# Patient Record
Sex: Female | Born: 1975 | Race: White | Hispanic: No | Marital: Single | Smoking: Current every day smoker
Health system: Southern US, Community
[De-identification: ages and names within clinical notes are randomized; demographics above are authoritative.]

---

## 1997-06-23 ENCOUNTER — Inpatient Hospital Stay (HOSPITAL_COMMUNITY): Admission: AD | Admit: 1997-06-23 | Discharge: 1997-06-23 | Payer: Self-pay | Admitting: Obstetrics

## 1997-08-17 ENCOUNTER — Inpatient Hospital Stay (HOSPITAL_COMMUNITY): Admission: AD | Admit: 1997-08-17 | Discharge: 1997-08-17 | Payer: Self-pay | Admitting: *Deleted

## 1997-11-05 ENCOUNTER — Inpatient Hospital Stay (HOSPITAL_COMMUNITY): Admission: AD | Admit: 1997-11-05 | Discharge: 1997-11-10 | Payer: Self-pay | Admitting: Obstetrics & Gynecology

## 1997-11-10 ENCOUNTER — Inpatient Hospital Stay (HOSPITAL_COMMUNITY): Admission: AD | Admit: 1997-11-10 | Discharge: 1997-11-10 | Payer: Self-pay | Admitting: *Deleted

## 1998-02-14 ENCOUNTER — Emergency Department (HOSPITAL_COMMUNITY): Admission: EM | Admit: 1998-02-14 | Discharge: 1998-02-14 | Payer: Self-pay | Admitting: Emergency Medicine

## 1998-03-03 ENCOUNTER — Emergency Department (HOSPITAL_COMMUNITY): Admission: EM | Admit: 1998-03-03 | Discharge: 1998-03-03 | Payer: Self-pay | Admitting: Emergency Medicine

## 1998-04-25 ENCOUNTER — Emergency Department (HOSPITAL_COMMUNITY): Admission: EM | Admit: 1998-04-25 | Discharge: 1998-04-25 | Payer: Self-pay | Admitting: Emergency Medicine

## 1998-05-09 ENCOUNTER — Emergency Department (HOSPITAL_COMMUNITY): Admission: EM | Admit: 1998-05-09 | Discharge: 1998-05-09 | Payer: Self-pay | Admitting: Emergency Medicine

## 1998-05-20 ENCOUNTER — Emergency Department (HOSPITAL_COMMUNITY): Admission: EM | Admit: 1998-05-20 | Discharge: 1998-05-20 | Payer: Self-pay | Admitting: Emergency Medicine

## 1998-07-05 ENCOUNTER — Emergency Department (HOSPITAL_COMMUNITY): Admission: EM | Admit: 1998-07-05 | Discharge: 1998-07-05 | Payer: Self-pay | Admitting: Emergency Medicine

## 1998-08-09 ENCOUNTER — Emergency Department (HOSPITAL_COMMUNITY): Admission: EM | Admit: 1998-08-09 | Discharge: 1998-08-09 | Payer: Self-pay | Admitting: Emergency Medicine

## 1998-08-12 ENCOUNTER — Emergency Department (HOSPITAL_COMMUNITY): Admission: EM | Admit: 1998-08-12 | Discharge: 1998-08-12 | Payer: Self-pay | Admitting: Emergency Medicine

## 1998-12-07 ENCOUNTER — Emergency Department (HOSPITAL_COMMUNITY): Admission: EM | Admit: 1998-12-07 | Discharge: 1998-12-08 | Payer: Self-pay | Admitting: Emergency Medicine

## 1999-01-18 ENCOUNTER — Emergency Department (HOSPITAL_COMMUNITY): Admission: EM | Admit: 1999-01-18 | Discharge: 1999-01-18 | Payer: Self-pay | Admitting: Podiatry

## 1999-01-25 ENCOUNTER — Emergency Department (HOSPITAL_COMMUNITY): Admission: EM | Admit: 1999-01-25 | Discharge: 1999-01-25 | Payer: Self-pay | Admitting: Emergency Medicine

## 1999-04-11 ENCOUNTER — Emergency Department (HOSPITAL_COMMUNITY): Admission: EM | Admit: 1999-04-11 | Discharge: 1999-04-11 | Payer: Self-pay | Admitting: Emergency Medicine

## 1999-04-23 ENCOUNTER — Inpatient Hospital Stay (HOSPITAL_COMMUNITY): Admission: AD | Admit: 1999-04-23 | Discharge: 1999-04-23 | Payer: Self-pay | Admitting: Obstetrics & Gynecology

## 1999-07-04 ENCOUNTER — Emergency Department (HOSPITAL_COMMUNITY): Admission: EM | Admit: 1999-07-04 | Discharge: 1999-07-04 | Payer: Self-pay | Admitting: Emergency Medicine

## 1999-07-28 ENCOUNTER — Emergency Department (HOSPITAL_COMMUNITY): Admission: EM | Admit: 1999-07-28 | Discharge: 1999-07-28 | Payer: Self-pay | Admitting: Emergency Medicine

## 1999-07-29 ENCOUNTER — Emergency Department (HOSPITAL_COMMUNITY): Admission: EM | Admit: 1999-07-29 | Discharge: 1999-07-29 | Payer: Self-pay | Admitting: *Deleted

## 1999-09-13 ENCOUNTER — Emergency Department (HOSPITAL_COMMUNITY): Admission: EM | Admit: 1999-09-13 | Discharge: 1999-09-13 | Payer: Self-pay | Admitting: Emergency Medicine

## 2000-08-03 ENCOUNTER — Inpatient Hospital Stay (HOSPITAL_COMMUNITY): Admission: EM | Admit: 2000-08-03 | Discharge: 2000-08-04 | Payer: Self-pay | Admitting: Psychiatry

## 2000-08-24 ENCOUNTER — Inpatient Hospital Stay (HOSPITAL_COMMUNITY): Admission: AD | Admit: 2000-08-24 | Discharge: 2000-08-24 | Payer: Self-pay | Admitting: *Deleted

## 2000-08-24 ENCOUNTER — Encounter: Payer: Self-pay | Admitting: *Deleted

## 2001-02-18 ENCOUNTER — Encounter (INDEPENDENT_AMBULATORY_CARE_PROVIDER_SITE_OTHER): Payer: Self-pay

## 2001-02-18 ENCOUNTER — Inpatient Hospital Stay (HOSPITAL_COMMUNITY): Admission: AD | Admit: 2001-02-18 | Discharge: 2001-02-25 | Payer: Self-pay | Admitting: *Deleted

## 2001-02-26 ENCOUNTER — Encounter: Admission: RE | Admit: 2001-02-26 | Discharge: 2001-03-28 | Payer: Self-pay | Admitting: *Deleted

## 2001-09-09 ENCOUNTER — Other Ambulatory Visit: Admission: RE | Admit: 2001-09-09 | Discharge: 2001-09-09 | Payer: Self-pay | Admitting: Family Medicine

## 2002-09-21 ENCOUNTER — Encounter: Payer: Self-pay | Admitting: Emergency Medicine

## 2002-09-21 ENCOUNTER — Emergency Department (HOSPITAL_COMMUNITY): Admission: EM | Admit: 2002-09-21 | Discharge: 2002-09-21 | Payer: Self-pay | Admitting: Emergency Medicine

## 2002-10-14 ENCOUNTER — Emergency Department (HOSPITAL_COMMUNITY): Admission: EM | Admit: 2002-10-14 | Discharge: 2002-10-14 | Payer: Self-pay | Admitting: Emergency Medicine

## 2003-03-02 ENCOUNTER — Other Ambulatory Visit: Admission: RE | Admit: 2003-03-02 | Discharge: 2003-03-02 | Payer: Self-pay | Admitting: Internal Medicine

## 2003-03-02 ENCOUNTER — Other Ambulatory Visit: Admission: RE | Admit: 2003-03-02 | Discharge: 2003-03-02 | Payer: Self-pay | Admitting: Obstetrics and Gynecology

## 2009-05-10 ENCOUNTER — Emergency Department (HOSPITAL_COMMUNITY): Admission: EM | Admit: 2009-05-10 | Discharge: 2009-05-10 | Payer: Self-pay | Admitting: Emergency Medicine

## 2009-07-03 ENCOUNTER — Emergency Department (HOSPITAL_COMMUNITY): Admission: EM | Admit: 2009-07-03 | Discharge: 2009-07-03 | Payer: Self-pay | Admitting: Emergency Medicine

## 2010-01-31 ENCOUNTER — Emergency Department (HOSPITAL_COMMUNITY)
Admission: EM | Admit: 2010-01-31 | Discharge: 2010-01-31 | Payer: Self-pay | Source: Home / Self Care | Admitting: Emergency Medicine

## 2010-04-17 LAB — URINALYSIS, ROUTINE W REFLEX MICROSCOPIC
Bilirubin Urine: NEGATIVE
Glucose, UA: NEGATIVE mg/dL
Hgb urine dipstick: NEGATIVE
Ketones, ur: NEGATIVE mg/dL
Nitrite: NEGATIVE
Protein, ur: NEGATIVE mg/dL
Specific Gravity, Urine: 1.012 (ref 1.005–1.030)
Urobilinogen, UA: 0.2 mg/dL (ref 0.0–1.0)
pH: 6 (ref 5.0–8.0)

## 2010-04-17 LAB — POCT PREGNANCY, URINE: Preg Test, Ur: NEGATIVE

## 2010-04-24 LAB — CBC
HCT: 40.4 % (ref 36.0–46.0)
Hemoglobin: 13.5 g/dL (ref 12.0–15.0)
MCHC: 33.3 g/dL (ref 30.0–36.0)
MCV: 89.2 fL (ref 78.0–100.0)
RDW: 13.5 % (ref 11.5–15.5)

## 2010-04-24 LAB — RAPID STREP SCREEN (MED CTR MEBANE ONLY): Streptococcus, Group A Screen (Direct): NEGATIVE

## 2010-04-24 LAB — DIFFERENTIAL
Basophils Absolute: 0.2 10*3/uL — ABNORMAL HIGH (ref 0.0–0.1)
Basophils Relative: 1 % (ref 0–1)
Eosinophils Absolute: 0 10*3/uL (ref 0.0–0.7)
Eosinophils Relative: 0 % (ref 0–5)
Monocytes Absolute: 0.7 10*3/uL (ref 0.1–1.0)

## 2010-04-24 LAB — URINALYSIS, ROUTINE W REFLEX MICROSCOPIC
Bilirubin Urine: NEGATIVE
Ketones, ur: NEGATIVE mg/dL
Nitrite: NEGATIVE
Urobilinogen, UA: 1 mg/dL (ref 0.0–1.0)

## 2010-04-24 LAB — POCT I-STAT, CHEM 8
Calcium, Ion: 1.15 mmol/L (ref 1.12–1.32)
Chloride: 100 mEq/L (ref 96–112)
Glucose, Bld: 99 mg/dL (ref 70–99)
HCT: 44 % (ref 36.0–46.0)

## 2010-05-16 ENCOUNTER — Emergency Department (HOSPITAL_COMMUNITY)
Admission: EM | Admit: 2010-05-16 | Discharge: 2010-05-16 | Disposition: A | Discharge status: Home / Self Care | Payer: Medicaid Other | Attending: Emergency Medicine | Admitting: Emergency Medicine

## 2010-05-16 ENCOUNTER — Emergency Department (HOSPITAL_COMMUNITY): Payer: Medicaid Other

## 2010-05-16 DIAGNOSIS — I10 Essential (primary) hypertension: Secondary | ICD-10-CM | POA: Insufficient documentation

## 2010-05-16 DIAGNOSIS — M25539 Pain in unspecified wrist: Secondary | ICD-10-CM | POA: Insufficient documentation

## 2010-05-16 DIAGNOSIS — J45909 Unspecified asthma, uncomplicated: Secondary | ICD-10-CM | POA: Insufficient documentation

## 2010-05-28 ENCOUNTER — Emergency Department (HOSPITAL_COMMUNITY)
Admission: EM | Admit: 2010-05-28 | Discharge: 2010-05-28 | Disposition: A | Discharge status: Home / Self Care | Payer: Medicaid Other | Attending: Emergency Medicine | Admitting: Emergency Medicine

## 2010-05-28 DIAGNOSIS — M545 Low back pain, unspecified: Secondary | ICD-10-CM | POA: Insufficient documentation

## 2010-05-28 DIAGNOSIS — M25539 Pain in unspecified wrist: Secondary | ICD-10-CM | POA: Insufficient documentation

## 2010-05-28 DIAGNOSIS — I10 Essential (primary) hypertension: Secondary | ICD-10-CM | POA: Insufficient documentation

## 2010-05-28 DIAGNOSIS — F17200 Nicotine dependence, unspecified, uncomplicated: Secondary | ICD-10-CM | POA: Insufficient documentation

## 2010-06-23 NOTE — Op Note (Signed)
[facility]  Patient:    Judy Cruz, Judy Cruz Visit Number: [id number] MRN: [id number]          Service Type: OBS Location: 910A 9144 01 Attending Physician:  Michaelle Copas Dictated by:   Conni Elliot, M.D. Proc. Date: 02/22/01 Admit Date:  02/18/2001                             Operative Report  PREOPERATIVE DIAGNOSIS:       Intrauterine pregnancy at 32-[redacted] weeks gestation, premature prolonged ruptured membranes, preterm labor, breech presentation, prior cesarean section x2, refuses trial of labor, desire for surgical sterilization.  POSTOPERATIVE DIAGNOSIS:      Intrauterine pregnancy at 32-[redacted] weeks gestation, premature prolonged ruptured membranes, preterm labor, breech presentation, prior cesarean section x2, refuses trial of labor, desire for surgical sterilization, adhesion of the anteperitoneum to the anterior abdominal wall with inclusive of the bladder and the window on the left approximately 1-2 cm in the prior uterine scar.  PROCEDURE:                    Low transverse cesarean section, bilateral tubal ligation.  SURGEON:                      Conni Elliot, M.D.  ANESTHESIA:                   Spinal.  OPERATIVE FINDINGS:           A 3 pound 11 ounce female with Apgars of 7 and 7.  Cord pH 7.35.  Placenta was sent to pathology.  The patient has a deep septated uterus and the fetus was in the right horn.  OPERATIVE PROCEDURE:          Patient was brought to the operating room. Patient received spinal anesthetic.  The patient was prepped and draped in a sterile fashion.  A low transverse Pfannenstiel incision was made.  Incision made through skin and subcutaneous fascia.  Peritoneal cavity was entered. Bladder flap was created.  Low transverse uterine incision was made.  Baby was brought through the operative field by grasping of the lower extremities and membranes were ruptured.  Baby was delivered without  difficulty and no head entrapment.  Cord was doubly clamped and cut and baby was handed to neonatologist in attendance.  Placenta was delivered spontaneously.  The uterus was closed in a routine fashion.  There was no well defined bladder flap to reapproximate nor was there anterior peritoneum to reapproximate.  The rectus muscles were brought together in the midline.  Fascia and subcutaneous skin were closed in routine fashion.  Estimated blood loss less than 800 cc. Needle and sponge count correct. Dictated by:   Conni Elliot, M.D. Attending Physician:  Michaelle Copas DD:  02/22/01 TD:  02/24/01 Job: [id number]

## 2010-06-23 NOTE — Discharge Summary (Signed)
[facility]  Patient:    Judy Cruz, Judy Cruz               MRN: [id number] Adm. Date:  [id number] Disc. Date: [id number] Attending:  Michaelle Copas                           Discharge Summary  CHIEF COMPLAINT AND HISTORY OF PRESENT ILLNESS:  This is the first admission to [facility] for this 35 year old female admitted due to suicidal ideas and anger outbursts, history of bipolar disorder, reports she has not been taking her medications regularly for the last two weeks.  Having mood swings with anger outbursts toward her boyfriend, denies suicidal ideas, denies homicidal ideas, denies auditory or visual hallucinations.  Has suspected she has been pregnant for some time now, does not know last menstrual period.  Increased stress as her mother was diagnosed with terminal cancer.  PAST PSYCHIATRIC HISTORY:  Sees Dr. Tyson Dense in [facility], was hospitalized last year, bipolar, borderline personality disorder, multiple suicide attempts.  ALCOHOL AND DRUG HISTORY:  Occasional use of alcohol, denies any other substances.  MEDICATIONS: 1. Neurontin 800 mg four times a day. 2. Zyprexa 10 mg at bed time.  MENTAL STATUS EXAMINATION:  Revealed a casually dressed female.  Grooming: Satisfactory.  Cooperative and pleasant.  Speech: Normal pace and tone, somewhat hyperverbal.  Mood: Elevated.  Thought processes: Logical, coherent, and relevant.  No suicidal ideas, no homicidal ideas.  Well oriented to person, place, and time.  ADMITTING DIAGNOSES: Axis I:    Bipolar disorder, hypomanic. Axis II:   Borderline personality disorder. Axis III:  Possible pregnancy. Axis IV:   Moderate. Axis V:    Global assessment of functioning upon admission 40-45.  HOSPITAL COURSE:  She was admitted and started in intensive individual and group psychotherapy.  On June 30 just by being on the unit, felt better. Wanted to stay on  her Neurontin.  She was aware that no medication is 100% safe in terms of possible adverse effects to the fetus, yet she understands the possible consequences and she wants to stay on the Neurontin and discontinue all the other medications.  She also wanted to quit smoking to give the baby a better chance.  Wants to be discharged.  As she is feeling much better, denies any suicidal ideas, denies any homicidal ideas.  Felt that she could handle her mothers cancer and overall, felt much better.  As she was not suicidal, she was not homicidal, and she willing and motivated to pursue outpatient followup, she was discharged.  DISCHARGE DIAGNOSES: Axis I:    Bipolar disorder, hypomanic. Axis II:   Borderline personality disorder.       Axis III:  1. Pregnancy.            2. Asthma Axis IV:   Moderate. Axis V:    Global assessment of functioning upon discharge 55-60.  DISCHARGE MEDICATIONS: 1. Neurontin 800 mg four times a day. 2. Prenatal vitamins.  FOLLOWUP:  Continue followup [facility] and go to [facility] in [place]. DD:  09/03/00 TD:  09/06/00 Job: [id number]

## 2010-06-23 NOTE — Discharge Summary (Signed)
[facility]  Patient:    Judy Cruz, Judy Cruz Visit Number: [id number] MRN: [id number]          Service Type: OBS Location: 910A 9144 01 Attending Physician:  Michaelle Copas Dictated by:   Emelda Fear, M.D. Admit Date:  02/18/2001 Discharge Date: 02/25/2001   CC:         Dr. Francee Piccolo  Dr. _______   Discharge Summary  DATE OF BIRTH:                Dec 20, 1975  ADMITTING PHYSICIAN:          Conni Elliot, M.D.  CONSULTATIONS:                None.  PROCEDURES:                   1. Low transverse cesarean section secondary to                                  breech presentation.                               2. Bilateral tubal ligation.  DISCHARGE DIAGNOSES:          1. Intrauterine pregnancy, at 32-6/[redacted] weeks                                  gestation.                               2. Preterm premature rupture of membranes.                               3. Preterm labor.                               4. Status post low transverse cesarean section                                  secondary to breech presentation and preterm                                  labor.                               5. Delivery of a viable, singleton preterm                                  infant.                               6. Endometritis following cesarean section.                               7. Bipolar disorder.  8. Tobacco abuse.                               9. History of asthma.  DISCHARGE MEDICATIONS:        1. Ibuprofen 600 mg p.o. q.6h. p.r.n. mild pain.                               2. Oxycodone 5 mg 1 p.o. q.6h. p.r.n. severe                                  pain.                               3. Ferrous sulfate 325 mg 1 tablet p.o. q.d. x3                                  months.                               4. Prenatal vitamins 1 tablet p.o. q.d. while                                  breast  feeding.  HISTORY OF PRESENT ILLNESS:   The patient is a 35 year old, G12, P2-1-8-3, presenting at 32-6/[redacted] weeks gestation secondary to spontaneous rupture of membranes. The patient was admitted for preterm premature rupture of membranes and rule out preterm labor. The patient was initiated on antibiotic therapy throughout her admission. On hospital day #4, the patient was demonstrating preterm labor with uterine contractions and cervical change. Ultrasound at bedside confirmed breech presentation. The patient was taken for a repeat low transverse cesarean section. Delivery of a viable preterm female infant, Apgars 7 in one minute and 7 at five minutes. The infant was taken to the NICU. The patient developed a postoperative fever on postoperative day #1. The patient was initiated on ampicillin and gentamicin for presumed endometritis. The patient was afebrile for 24 hours prior to discharge. The patient will follow up with obstetricians in [place] in six weeks for postpartum evaluation.  LABORATORY DATA:              Discharge hemoglobin of 9.5, hematocrit 27.7.  RECOMMENDATIONS:              Discharge recommendations are as follows: Activity: No heavy lifting for the next four weeks.  Diet: Routine. Medications: As above. Instructions: Routine postpartum and postoperative instructions. The patient is discharged to home. Dictated by:   Emelda Fear, M.D. Attending Physician:  Michaelle Copas DD:  02/25/01 TD:  02/26/01 Job: [id number]

## 2010-12-27 ENCOUNTER — Ambulatory Visit
Admission: RE | Admit: 2010-12-27 | Discharge: 2010-12-27 | Disposition: A | Discharge status: Home / Self Care | Payer: Medicaid Other | Source: Ambulatory Visit | Attending: Family Medicine | Admitting: Family Medicine

## 2010-12-27 ENCOUNTER — Other Ambulatory Visit: Payer: Self-pay | Admitting: Family Medicine

## 2012-03-17 IMAGING — CT CT ABD-PELV W/ CM
2 of 4 series · 17 of 46 positions shown, 19 images · IV contrast (READICAT/WATER & [ID] OMNI 300)
Comparison: None.

CLINICAL DATA: Right lower quadrant abdominal pain.  Nausea.
Question appendicitis.

CT ABDOMEN AND PELVIS WITH CONTRAST
TECHNIQUE: Multidetector CT imaging of the abdomen and pelvis was
performed following the standard protocol during bolus
administration of intravenous contrast.
Contrast:  100 ml Fmnipaque-CEE.

[Series 2: abd/pelvis with · axial · 0.70mm/px · z∈[-371,-6]mm · 14 of 80 slices shown, 16 images]
[im 4/80  soft-tissue]
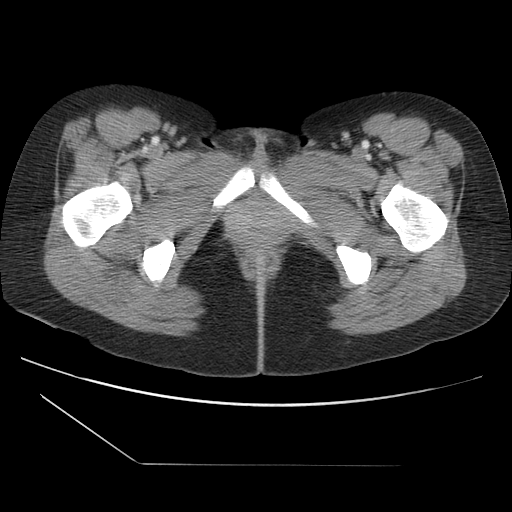
[im 4/80  bone]
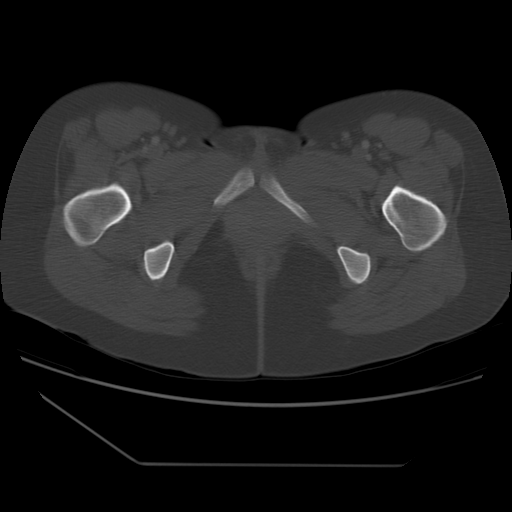
[im 10/80  soft-tissue]
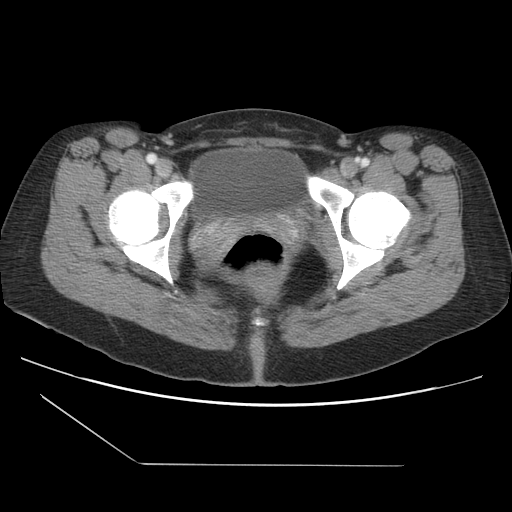
[im 17/80  soft-tissue]
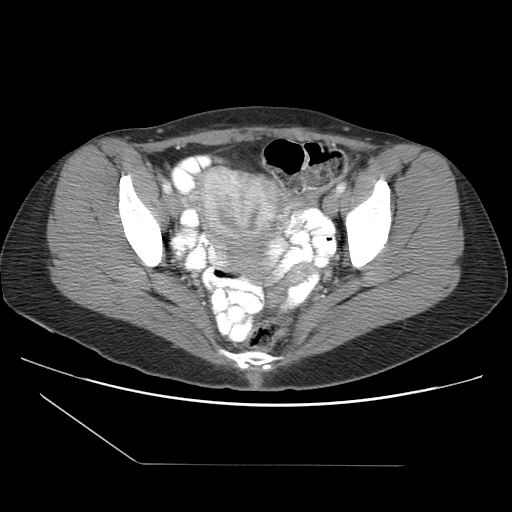
[im 20/80  soft-tissue]
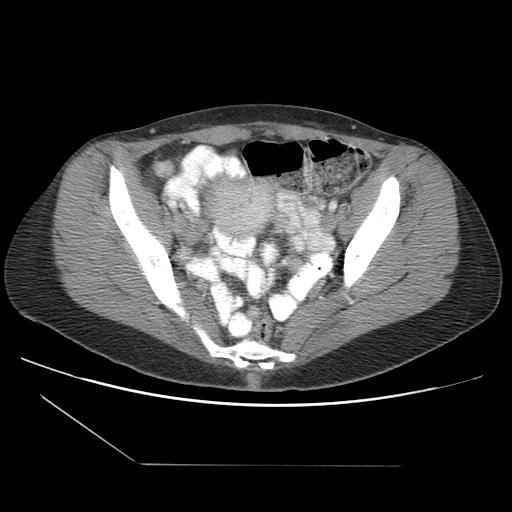
[im 27/80  soft-tissue]
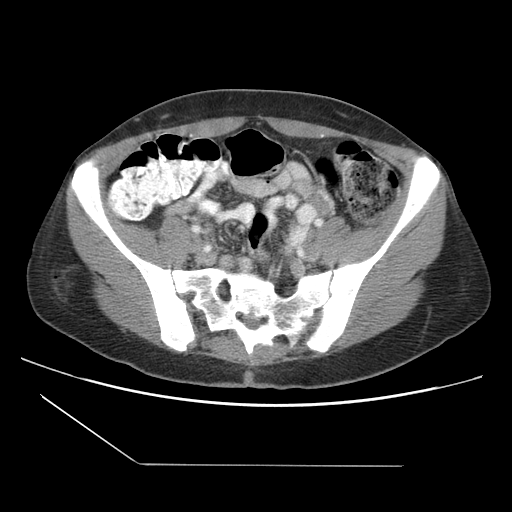
[im 33/80  soft-tissue]
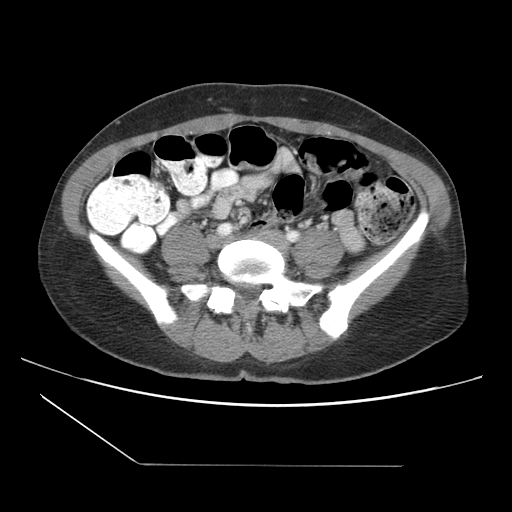
[im 37/80  soft-tissue]
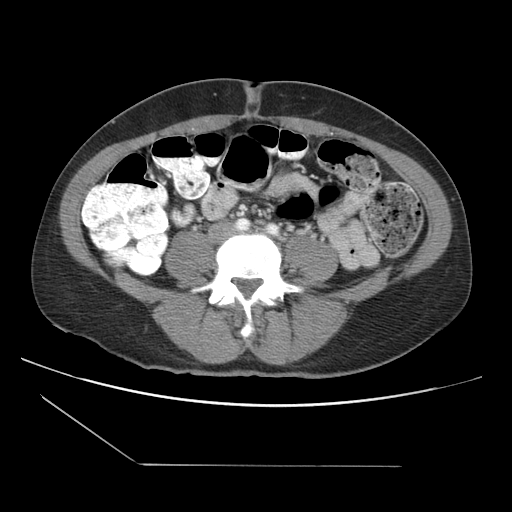
[im 43/80  soft-tissue]
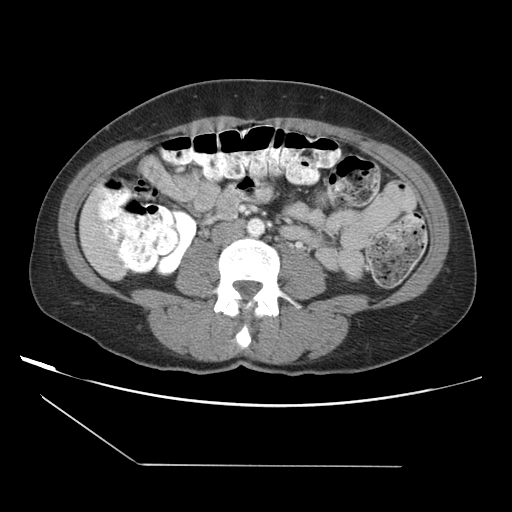
[im 47/80  soft-tissue]
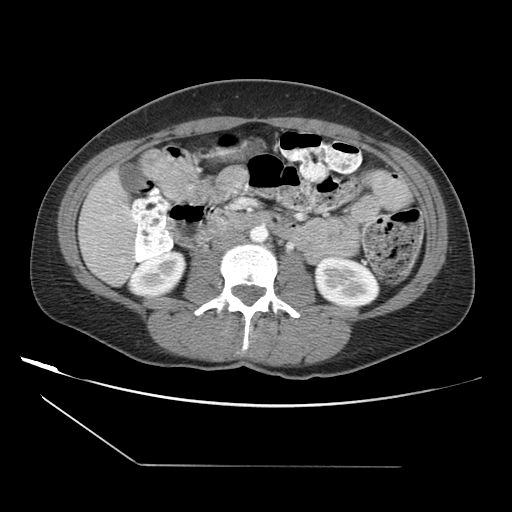
[im 47/80  bone]
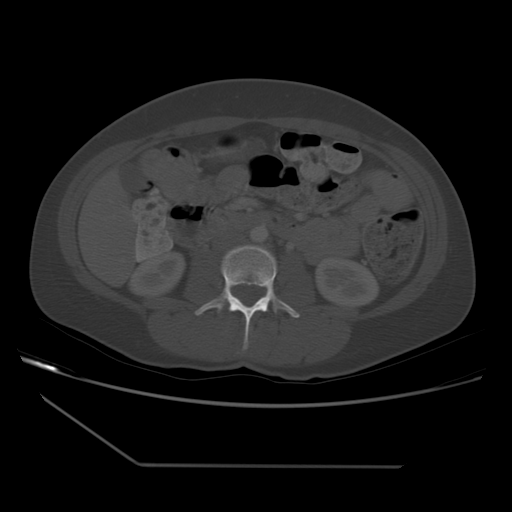
[im 53/80  soft-tissue]
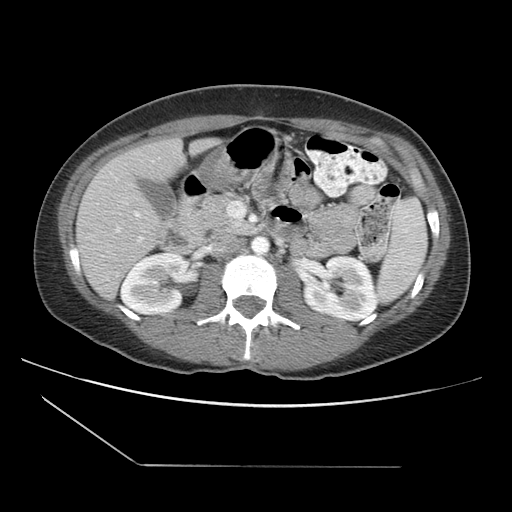
[im 60/80  soft-tissue]
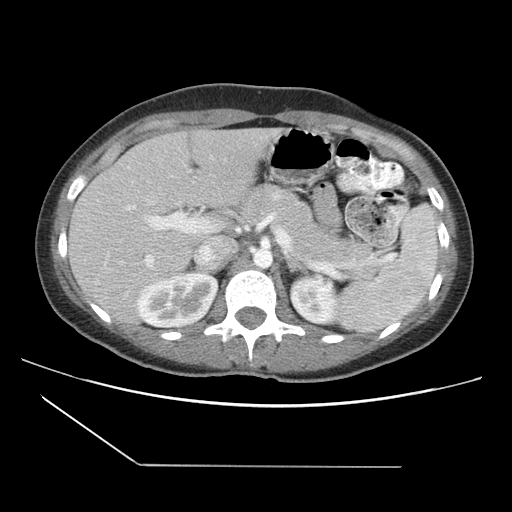
[im 63/80  soft-tissue]
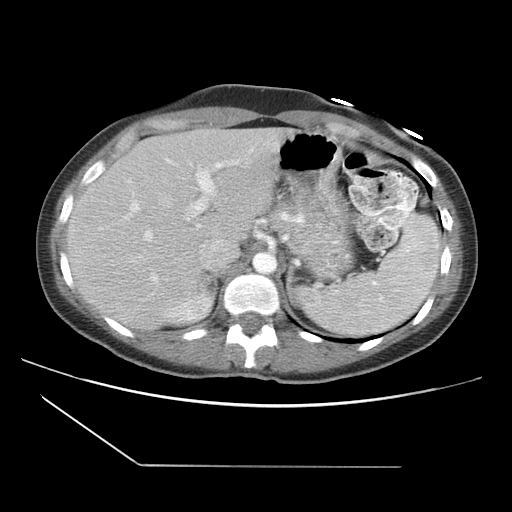
[im 70/80  soft-tissue]
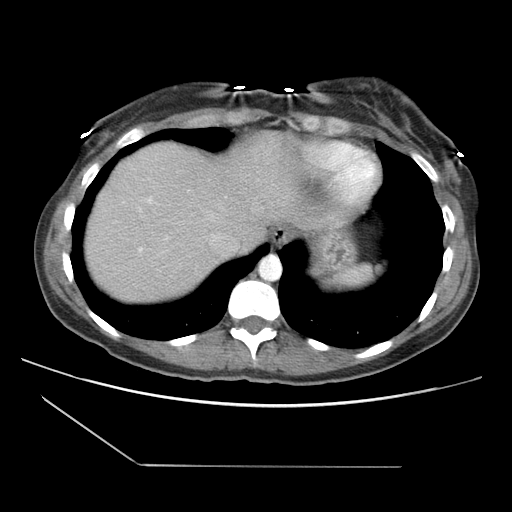
[im 76/80  soft-tissue]
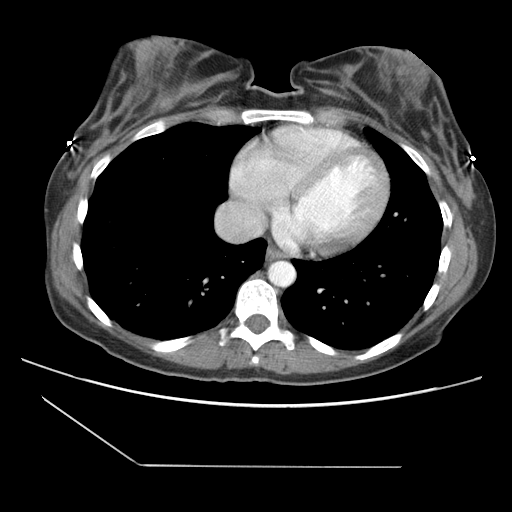

[Series 400: cor · coronal · 0.87mm/px · 3 of 90 slices shown]
[im 30/90  soft-tissue]
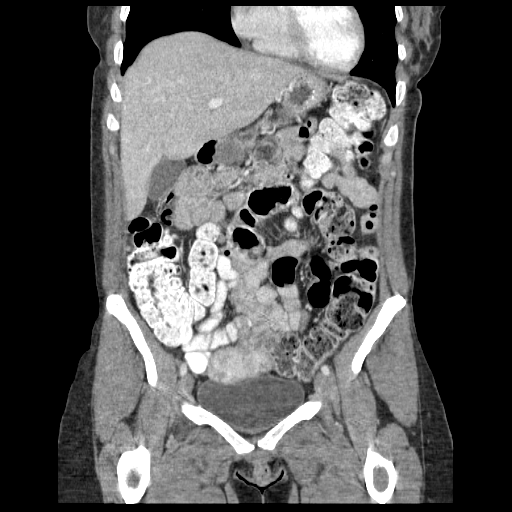
[im 40/90  soft-tissue]
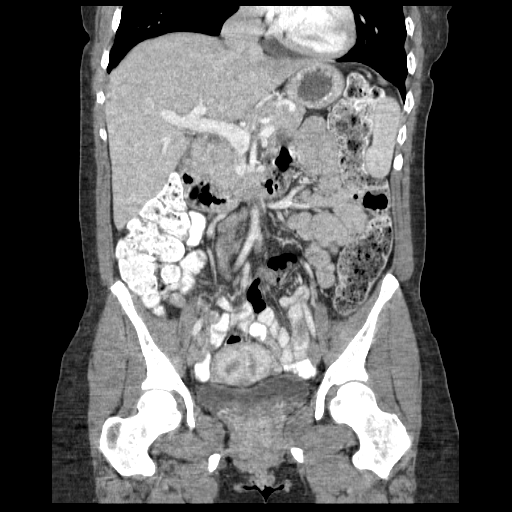
[im 50/90  soft-tissue]
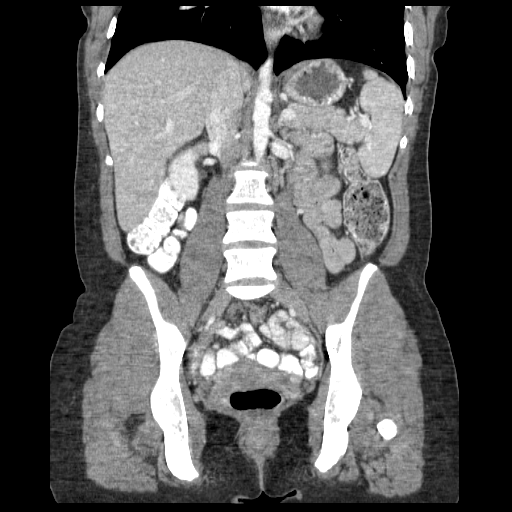

[17 of 46 positions shown; findings below may reference images not displayed]

FINDINGS: There is some dependent atelectasis in the lung bases.
No pleural or pericardial effusion.

The appendix is visualized and appears normal on image 37.  The
patient has a redundant colon with the cecum and appendix in the
right upper quadrant just below the gallbladder fossa.  No fluid or
lymphadenopathy is seen.  The gallbladder, liver, adrenal glands,
spleen, pancreas and kidneys all appear normal.  The stomach and
small and large bowel are normal in appearance.  The patient has a
septate or bicornuate uterus.  Adnexa are unremarkable.  No focal
bony abnormality.
IMPRESSION: Negative for appendicitis or acute abnormality.  Septate or
bicornuate uterus noted.

## 2020-08-30 ENCOUNTER — Emergency Department (HOSPITAL_BASED_OUTPATIENT_CLINIC_OR_DEPARTMENT_OTHER)
Admission: EM | Admit: 2020-08-30 | Discharge: 2020-08-30 | Disposition: A | Discharge status: Home / Self Care | Payer: Medicaid Other | Attending: Emergency Medicine | Admitting: Emergency Medicine

## 2020-08-30 ENCOUNTER — Other Ambulatory Visit: Payer: Self-pay

## 2020-08-30 ENCOUNTER — Encounter (HOSPITAL_BASED_OUTPATIENT_CLINIC_OR_DEPARTMENT_OTHER): Payer: Self-pay | Admitting: *Deleted

## 2020-08-30 DIAGNOSIS — G8929 Other chronic pain: Secondary | ICD-10-CM | POA: Insufficient documentation

## 2020-08-30 DIAGNOSIS — M549 Dorsalgia, unspecified: Secondary | ICD-10-CM | POA: Diagnosis not present

## 2020-08-30 DIAGNOSIS — F17210 Nicotine dependence, cigarettes, uncomplicated: Secondary | ICD-10-CM | POA: Diagnosis not present

## 2020-08-30 LAB — CBC WITH DIFFERENTIAL/PLATELET
Abs Immature Granulocytes: 0.03 10*3/uL (ref 0.00–0.07)
Basophils Absolute: 0 10*3/uL (ref 0.0–0.1)
Basophils Relative: 0 %
Eosinophils Absolute: 0.4 10*3/uL (ref 0.0–0.5)
Eosinophils Relative: 4 %
HCT: 38.9 % (ref 36.0–46.0)
Hemoglobin: 12.7 g/dL (ref 12.0–15.0)
Immature Granulocytes: 0 %
Lymphocytes Relative: 35 %
Lymphs Abs: 3.3 10*3/uL (ref 0.7–4.0)
MCH: 30.6 pg (ref 26.0–34.0)
MCHC: 32.6 g/dL (ref 30.0–36.0)
MCV: 93.7 fL (ref 80.0–100.0)
Monocytes Absolute: 0.6 10*3/uL (ref 0.1–1.0)
Monocytes Relative: 6 %
Neutro Abs: 5.1 10*3/uL (ref 1.7–7.7)
Neutrophils Relative %: 55 %
Platelets: 242 10*3/uL (ref 150–400)
RBC: 4.15 MIL/uL (ref 3.87–5.11)
RDW: 13.3 % (ref 11.5–15.5)
WBC: 9.4 10*3/uL (ref 4.0–10.5)
nRBC: 0 % (ref 0.0–0.2)

## 2020-08-30 LAB — COMPREHENSIVE METABOLIC PANEL
ALT: 18 U/L (ref 0–44)
AST: 18 U/L (ref 15–41)
Albumin: 3.6 g/dL (ref 3.5–5.0)
Alkaline Phosphatase: 79 U/L (ref 38–126)
Anion gap: 6 (ref 5–15)
BUN: 12 mg/dL (ref 6–20)
CO2: 24 mmol/L (ref 22–32)
Calcium: 8.4 mg/dL — ABNORMAL LOW (ref 8.9–10.3)
Chloride: 109 mmol/L (ref 98–111)
Creatinine, Ser: 0.82 mg/dL (ref 0.44–1.00)
GFR, Estimated: >60 mL/min (ref >60)
Glucose, Bld: 108 mg/dL — ABNORMAL HIGH (ref 70–99)
Potassium: 3.3 mmol/L — ABNORMAL LOW (ref 3.5–5.1)
Sodium: 139 mmol/L (ref 135–145)
Total Bilirubin: 0.1 mg/dL — ABNORMAL LOW (ref 0.3–1.2)
Total Protein: 6.2 g/dL — ABNORMAL LOW (ref 6.5–8.1)

## 2020-08-30 LAB — URINALYSIS, ROUTINE W REFLEX MICROSCOPIC
Bilirubin Urine: NEGATIVE
Glucose, UA: NEGATIVE mg/dL
Hgb urine dipstick: NEGATIVE
Ketones, ur: NEGATIVE mg/dL
Nitrite: NEGATIVE
Protein, ur: NEGATIVE mg/dL
Specific Gravity, Urine: 1.01 (ref 1.005–1.030)
pH: 6.5 (ref 5.0–8.0)

## 2020-08-30 LAB — RAPID URINE DRUG SCREEN, HOSP PERFORMED
Amphetamines: NOT DETECTED
Barbiturates: NOT DETECTED
Benzodiazepines: POSITIVE — AB
Cocaine: NOT DETECTED
Opiates: NOT DETECTED
Tetrahydrocannabinol: NOT DETECTED

## 2020-08-30 LAB — URINALYSIS, MICROSCOPIC (REFLEX): RBC / HPF: NONE SEEN RBC/hpf (ref 0–5)

## 2020-08-30 MED ORDER — KETOROLAC TROMETHAMINE 30 MG/ML IJ SOLN
15.0000 mg | Freq: Once | INTRAMUSCULAR | Status: AC
  Administered 2020-08-30: 15 mg via INTRAMUSCULAR
  Filled 2020-08-30: qty 1

## 2020-08-30 MED ORDER — LIDOCAINE 5 % EX PTCH: 1.0000 | MEDICATED_PATCH | CUTANEOUS | 0 refills | Status: AC

## 2020-08-30 NOTE — ED Provider Notes (Signed)
[facility] EMERGENCY DEPARTMENT Provider Note   CSN: [id number] Arrival date & time: 08/30/20  1824     History Chief Complaint  Patient presents with   Back Pain    Judy Cruz is a 45 y.o. female.  45 year old female with history of fibromyalgia, "deteriorating spine disease," degenerative disc disease, presents with complaint of back pain at x5 days without fall or injury.  Patient went to urgent care today however was sent to the emergency room for further evaluation. Patient reports this to be a recurrent problem and is occasionally wheelchair bound when she has these episodes. No loss of bowel or bladder control, no abdominal pain, no groin numbness. Denies taking anything today other than her psych meds.      History reviewed. No pertinent past medical history.  There are no problems to display for this patient.   History reviewed. No pertinent surgical history.   OB History   No obstetric history on file.     No family history on file.  Social History   Tobacco Use   Smoking status: Every Day    Types: Cigarettes   Smokeless tobacco: Never  Vaping Use   Vaping Use: Every day  Substance Use Topics   Alcohol use: Never   Drug use: Never    Home Medications Prior to Admission medications   Not on File    Allergies    Bee venom, Codeine, Erythromycin base, Fish allergy, Morphine, Shellfish-derived products, Acetaminophen, Aripiprazole, Erythromycin, Nickel, and Other  Review of Systems   Review of Systems  Constitutional:  Negative for fever.  Respiratory:  Negative for shortness of breath.   Cardiovascular:  Negative for chest pain.  Gastrointestinal:  Negative for abdominal pain, constipation, diarrhea, nausea and vomiting.  Genitourinary:  Negative for dysuria.  Musculoskeletal:  Positive for back pain and gait problem.  Skin:  Negative for rash and wound.  Allergic/Immunologic: Negative for immunocompromised state.   Neurological:  Positive for weakness. Negative for numbness.  All other systems reviewed and are negative.  Physical Exam Updated Vital Signs BP 102/71   Pulse 66   Temp 98.4 F (36.9 C) (Oral)   Resp 16   Ht 5\' 3"  (1.6 m)   Wt 61.7 kg   SpO2 97%   BMI 24.09 kg/m   Physical Exam Vitals and nursing note reviewed.  Constitutional:      General: She is not in acute distress.    Appearance: She is well-developed. She is not diaphoretic.  HENT:     Head: Normocephalic and atraumatic.     Mouth/Throat:     Mouth: Mucous membranes are moist.  Eyes:     Conjunctiva/sclera: Conjunctivae normal.  Cardiovascular:     Rate and Rhythm: Normal rate and regular rhythm.     Pulses: Normal pulses.     Heart sounds: Normal heart sounds.  Pulmonary:     Effort: Pulmonary effort is normal.     Breath sounds: Normal breath sounds.  Abdominal:     Palpations: Abdomen is soft.     Tenderness: There is no abdominal tenderness. There is no right CVA tenderness or left CVA tenderness.  Musculoskeletal:        General: Tenderness present. No swelling, deformity or signs of injury.     Right lower leg: No edema.     Left lower leg: No edema.     Comments: Found to have mild right and left lower back pain without midline bony tenderness.  Equal arm and leg strength.  Reflexes symmetric.  Skin:    General: Skin is warm and dry.     Findings: No bruising, erythema or rash.  Neurological:     Mental Status: She is alert and oriented to person, place, and time.     Sensory: No sensory deficit.     Motor: No weakness.     Deep Tendon Reflexes: Reflexes normal. Babinski sign absent on the right side. Babinski sign absent on the left side.     Reflex Scores:      Patellar reflexes are 2+ on the right side and 2+ on the left side.      Achilles reflexes are 1+ on the right side and 1+ on the left side.    Comments: Patient is groggy but follows commands, is able to pull herself up in bed, move  extremities intentionally and symmetrically.  Psychiatric:        Behavior: Behavior is cooperative.    ED Results / Procedures / Treatments   Labs (all labs ordered are listed, but only abnormal results are displayed) Labs Reviewed  URINALYSIS, ROUTINE W REFLEX MICROSCOPIC - Abnormal; Notable for the following components:      Result Value   Leukocytes,Ua TRACE (*)    All other components within normal limits  RAPID URINE DRUG SCREEN, HOSP PERFORMED - Abnormal; Notable for the following components:   Benzodiazepines POSITIVE (*)    All other components within normal limits  COMPREHENSIVE METABOLIC PANEL - Abnormal; Notable for the following components:   Potassium 3.3 (*)    Glucose, Bld 108 (*)    Calcium 8.4 (*)    Total Protein 6.2 (*)    Total Bilirubin 0.1 (*)    All other components within normal limits  URINALYSIS, MICROSCOPIC (REFLEX) - Abnormal; Notable for the following components:   Bacteria, UA FEW (*)    All other components within normal limits  CBC WITH DIFFERENTIAL/PLATELET    EKG None  Radiology No results found.  Procedures Procedures   Medications Ordered in ED Medications  ketorolac (TORADOL) 30 MG/ML injection 15 mg (has no administration in time range)    ED Course  I have reviewed the triage vital signs and the nursing notes.  Pertinent labs & imaging results that were available during my care of the patient were reviewed by me and considered in my medical decision making (see chart for details).  Clinical Course as of 08/30/20 2328  Tue Aug 30, 2020  [id number] 45 year old female with acute on chronic back pain.  Patient is groggy but follows commands, states she has been taking her psych meds. Labs reviewed without significant findings including CBC with normal white blood cell count, CMP, urinalysis does not suggest UTI, UDS is positive for benzos for which she is prescribed.  Vitals are reassuring, she is afebrile with O2 sat of 97% on room  air. Review of past medical records, similar presentation previously.  No midline bony tenderness, no abdominal pain.  Case discussed with Dr. Jacqulyn Bath, ER attending who has seen the patient.  Plan is for injection of Toradol for her pain and recommend she follow-up with her care team and continue with her physical therapy and PCP care. [LM]    Clinical Course User Index [LM] Alden Hipp   MDM Rules/Calculators/A&P                           Final Clinical Impression(s) /  ED Diagnoses Final diagnoses:  Chronic bilateral back pain, unspecified back location    Rx / DC Orders ED Discharge Orders     None        Alden Hipp 08/30/20 2328    Maia Plan, MD 09/05/20 1250

## 2020-08-30 NOTE — ED Triage Notes (Signed)
Back pain x 5 days. She was seen at [facility] earlier today.

## 2020-08-30 NOTE — ED Notes (Signed)
Pt very lethargic, speaking slowly, reports 10/10 pain but is falling asleep while speaking; EDP aware

## 2020-08-30 NOTE — ED Notes (Signed)
Pt is asleep in the wheelchair sitting in the w/a with an opened bag of chills poured out on the blanket she is covered with.

## 2020-08-30 NOTE — Discharge Instructions (Addendum)
You are given an injection of pain medication in the emergency room today.  Recommend home to rest, recheck with your doctor this week.

## 2020-08-31 NOTE — ED Notes (Signed)
Pt reports "the doctor, he told me they were gonna give me a prescription"; pt falling asleep while speaking; this RN checked with PA and EDP, who reported there was no discussion about a prescription, however they would send in for lidoderm patches; pt informed and repeatedly stated that "that's not what they said"; pt still rates 10/10 pain although was asleep prior to discharge and is still very sleepy; denies taking any medication PTA
